# Patient Record
Sex: Male | Born: 1995 | Race: White | Hispanic: No | Marital: Single | Smoking: Never smoker
Health system: Southern US, Community
[De-identification: ages and names within clinical notes are randomized; demographics above are authoritative.]

---
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
## 2019-02-15 ENCOUNTER — Other Ambulatory Visit: Payer: Self-pay

## 2019-02-15 DIAGNOSIS — Z20822 Contact with and (suspected) exposure to covid-19: Secondary | ICD-10-CM

## 2019-02-17 LAB — NOVEL CORONAVIRUS, NAA: SARS-CoV-2, NAA: NOT DETECTED

## 2019-10-25 ENCOUNTER — Encounter: Payer: Self-pay | Admitting: Podiatry

## 2019-10-25 ENCOUNTER — Ambulatory Visit (INDEPENDENT_AMBULATORY_CARE_PROVIDER_SITE_OTHER): Payer: BC Managed Care – PPO

## 2019-10-25 ENCOUNTER — Other Ambulatory Visit: Payer: Self-pay

## 2019-10-25 ENCOUNTER — Ambulatory Visit (INDEPENDENT_AMBULATORY_CARE_PROVIDER_SITE_OTHER): Payer: BC Managed Care – PPO | Admitting: Podiatry

## 2019-10-25 DIAGNOSIS — M76821 Posterior tibial tendinitis, right leg: Secondary | ICD-10-CM

## 2019-10-25 DIAGNOSIS — M79671 Pain in right foot: Secondary | ICD-10-CM | POA: Diagnosis not present

## 2019-10-25 DIAGNOSIS — M79672 Pain in left foot: Secondary | ICD-10-CM | POA: Diagnosis not present

## 2019-10-25 DIAGNOSIS — M76822 Posterior tibial tendinitis, left leg: Secondary | ICD-10-CM

## 2019-10-25 MED ORDER — DICLOFENAC SODIUM 75 MG PO TBEC: 75.0000 mg | DELAYED_RELEASE_TABLET | Freq: Two times a day (BID) | ORAL | 1 refills | Status: DC

## 2019-10-25 MED ORDER — METHYLPREDNISOLONE 4 MG PO TBPK: ORAL_TABLET | ORAL | 0 refills | Status: AC

## 2019-10-25 NOTE — Progress Notes (Signed)
   HPI: 24 y.o. male presenting today as a reestablish new patient for evaluation of bilateral foot pain.  Patient states that he does have a history of flatfoot reconstructive surgery performed by Dr. Al Corpus approximately 8 years ago.  He states that last week he went hiking for a short hike and did not wear his orthotics.  He noticed increased pain and tenderness to the bilateral inside of the ankles.  Left greater than right.  He has been taking Advil recently for treatment.   No past medical history on file.   Physical Exam: General: The patient is alert and oriented x3 in no acute distress.  Dermatology: Skin is warm, dry and supple bilateral lower extremities. Negative for open lesions or macerations.  Vascular: Palpable pedal pulses bilaterally.  There is some mild erythema and edema noted to the navicular tuberosity of the left foot.  Associated pain on palpation. Capillary refill within normal limits.  Neurological: Epicritic and protective threshold grossly intact bilaterally.   Musculoskeletal Exam: Range of motion within normal limits to all pedal and ankle joints bilateral. Muscle strength 5/5 in all groups bilateral.  Pain on palpation noted to the bilateral navicular tuberosities at the insertion of the posterior tibial tendon.  Radiographic Exam:  Normal osseous mineralization. Joint spaces preserved. No fracture/dislocation/boney destruction.  Tendon anchors noted to the bilateral navicular tuberosities.  Anchors appear intact and stable.  Assessment: 1.  Insertional posterior tibial tendinitis bilateral.  L > R   Plan of Care:  1. Patient evaluated. X-Rays reviewed.  2.  Injection of 0.5 cc Celestone Soluspan injection at the insertion of the posterior tibial tendon sheath just plantar to the navicular left 3.  Prescription for Medrol Dosepak 4.  Prescription for diclofenac 75 mg 2 times daily to begin after completion of the Dosepak 5.  Cam boot dispensed.  Weightbearing  as tolerated left lower extremity 6.  Return to clinic in 3 weeks      Felecia Shelling, DPM [facility]  Dr. Felecia Shelling, DPM    [address] [place], [place] [address]                Office [phone]  Fax [phone]

## 2019-10-26 ENCOUNTER — Ambulatory Visit (INDEPENDENT_AMBULATORY_CARE_PROVIDER_SITE_OTHER): Payer: BC Managed Care – PPO | Admitting: Orthotics

## 2019-10-26 DIAGNOSIS — M76821 Posterior tibial tendinitis, right leg: Secondary | ICD-10-CM

## 2019-10-26 DIAGNOSIS — M76822 Posterior tibial tendinitis, left leg: Secondary | ICD-10-CM

## 2019-10-26 NOTE — Progress Notes (Signed)
Patient presents with history of PTTD/Pes Planus (acquired).  Patient demonstrated medial shift talus/drop navicular, and medial column collapse.   Plan is for CMFO w/ longitudinal arch support, rear foot stability to address eversion.  Fabrication will be with semi rigid/rigid poly pro shell, deep heel seat, wide, padding under spenco cover. Plan on                                      To fabricate. 

## 2019-11-15 ENCOUNTER — Ambulatory Visit: Payer: BC Managed Care – PPO | Admitting: Podiatry

## 2019-11-23 ENCOUNTER — Other Ambulatory Visit: Payer: Self-pay

## 2019-11-23 ENCOUNTER — Encounter: Payer: Self-pay | Admitting: *Deleted

## 2019-11-23 ENCOUNTER — Other Ambulatory Visit: Payer: BC Managed Care – PPO | Admitting: Orthotics

## 2019-11-29 ENCOUNTER — Ambulatory Visit: Payer: BC Managed Care – PPO | Admitting: Podiatry

## 2019-12-23 ENCOUNTER — Other Ambulatory Visit: Payer: Self-pay | Admitting: Podiatry

## 2019-12-26 NOTE — Telephone Encounter (Signed)
Please Advise

## 2020-11-02 ENCOUNTER — Other Ambulatory Visit: Payer: Self-pay

## 2020-11-02 DIAGNOSIS — Z0283 Encounter for blood-alcohol and blood-drug test: Secondary | ICD-10-CM
